# Patient Record
Sex: Female | Born: 2000 | Race: White | Hispanic: No | Marital: Single | Smoking: Never smoker
Health system: Southern US, Community
[De-identification: ages and names within clinical notes are randomized; demographics above are authoritative.]

## PROBLEM LIST (undated history)

## (undated) HISTORY — PX: DENTAL SURGERY: SHX609

## (undated) HISTORY — PX: HEMANGIOMA EXCISION: SHX1734

---
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
## 2020-05-18 ENCOUNTER — Ambulatory Visit: Payer: Managed Care, Other (non HMO) | Attending: Internal Medicine

## 2020-05-18 DIAGNOSIS — Z23 Encounter for immunization: Secondary | ICD-10-CM

## 2020-05-18 NOTE — Progress Notes (Signed)
   Covid-19 Vaccination Clinic  Name:  Traci Owen    MRN: [id number] DOB: 2001-04-08  05/18/2020  [name] was observed post Covid-19 immunization for 15 minutes without incident. She was provided with Vaccine Information Sheet and instruction to access the V-Safe system.   [name] was instructed to call 911 with any severe reactions post vaccine: Marland Kitchen Difficulty breathing  . Swelling of face and throat  . A fast heartbeat  . A bad rash all over body  . Dizziness and weakness   Immunizations Administered    Name Date Dose VIS Date Route   Pfizer COVID-19 Vaccine 05/18/2020 10:05 AM 0.3 mL 01/29/2019 Intramuscular   Manufacturer: [employer], [employer]   Lot: [id number]   NDC: [address]-4

## 2020-09-09 ENCOUNTER — Other Ambulatory Visit: Payer: Self-pay

## 2020-09-09 ENCOUNTER — Ambulatory Visit
Admission: RE | Admit: 2020-09-09 | Discharge: 2020-09-09 | Disposition: A | Discharge status: Home / Self Care | Payer: BC Managed Care – PPO | Source: Ambulatory Visit | Attending: Emergency Medicine | Admitting: Emergency Medicine

## 2020-09-09 VITALS — BP 121/75 | HR 62 | Temp 98.4°F | Resp 17

## 2020-09-09 DIAGNOSIS — J0140 Acute pansinusitis, unspecified: Secondary | ICD-10-CM | POA: Diagnosis not present

## 2020-09-09 DIAGNOSIS — H66002 Acute suppurative otitis media without spontaneous rupture of ear drum, left ear: Secondary | ICD-10-CM

## 2020-09-09 MED ORDER — BENZONATATE 100 MG PO CAPS: 100.0000 mg | ORAL_CAPSULE | Freq: Three times a day (TID) | ORAL | 0 refills | Status: DC

## 2020-09-09 MED ORDER — DOXYCYCLINE HYCLATE 100 MG PO CAPS: 100.0000 mg | ORAL_CAPSULE | Freq: Two times a day (BID) | ORAL | 0 refills | Status: DC

## 2020-09-09 NOTE — Discharge Instructions (Signed)
Get plenty of rest and push fluids Tessalon Perles prescribed for cough Doxycycline prescribed for sinus infection and ear infection Use OTC zyrtec for nasal congestion, runny nose, and/or sore throat Use OTC flonase for nasal congestion and runny nose Use medications daily for symptom relief Use OTC medications like ibuprofen or tylenol as needed fever or pain Call or go to the ED if you have any new or worsening symptoms such as fever, worsening cough, shortness of breath, chest tightness, chest pain, turning blue, changes in mental status, etc..Marland Kitchen

## 2020-09-09 NOTE — ED Provider Notes (Signed)
[facility]   [id number] 09/09/20 Arrival Time: 1611   CC: sinus pain  SUBJECTIVE: History from: patient.  Traci Owen is a 19 y.o. female who presents with sinus congestion, pain, pressure, sore throat, and cough x 1 weeks.  Denies known sick exposure to COVID, flu or strep.  COVID test negative 5 days ago.  Has tried OTC medications without relief.  Symptoms are made worse in the morning.  Reports previous symptoms in the past with sinus infection.   Denies fever, chills, SOB, wheezing, chest pain, nausea, changes in bowel or bladder habits.    ROS: As per HPI.  All other pertinent ROS negative.     History reviewed. No pertinent past medical history. Past Surgical History:  Procedure Laterality Date  . DENTAL SURGERY    . HEMANGIOMA EXCISION     Allergies  Allergen Reactions  . Penicillins Hives  . Sulfa Antibiotics Hives   No current facility-administered medications on file prior to encounter.   No current outpatient medications on file prior to encounter.   Social History   Socioeconomic History  . Marital status: Single    Spouse name: Not on file  . Number of children: Not on file  . Years of education: Not on file  . Highest education level: Not on file  Occupational History  . Not on file  Tobacco Use  . Smoking status: Never Smoker  . Smokeless tobacco: Never Used  Substance and Sexual Activity  . Alcohol use: Never  . Drug use: Never  . Sexual activity: Not on file  Other Topics Concern  . Not on file  Social History Narrative  . Not on file   Social Determinants of Health   Financial Resource Strain:   . Difficulty of Paying Living Expenses: Not on file  Food Insecurity:   . Worried About Programme researcher, broadcasting/film/video in the Last Year: Not on file  . Ran Out of Food in the Last Year: Not on file  Transportation Needs:   . Lack of Transportation (Medical): Not on file  . Lack of Transportation (Non-Medical): Not on file  Physical Activity:    . Days of Exercise per Week: Not on file  . Minutes of Exercise per Session: Not on file  Stress:   . Feeling of Stress : Not on file  Social Connections:   . Frequency of Communication with Friends and Family: Not on file  . Frequency of Social Gatherings with Friends and Family: Not on file  . Attends Religious Services: Not on file  . Active Member of Clubs or Organizations: Not on file  . Attends Banker Meetings: Not on file  . Marital Status: Not on file  Intimate Partner Violence:   . Fear of Current or Ex-Partner: Not on file  . Emotionally Abused: Not on file  . Physically Abused: Not on file  . Sexually Abused: Not on file   Family History  Problem Relation Age of Onset  . Cancer Father     OBJECTIVE:  Vitals:   09/09/20 1704  BP: 121/75  Pulse: 62  Resp: 17  Temp: 98.4 F (36.9 C)  TempSrc: Oral  SpO2: 98%     General appearance: alert; well-appearing, nontoxic; speaking in full sentences and tolerating own secretions HEENT: NCAT; Ears: EACs clear, RT TM pearly gray, LT TM erythematous; Eyes: PERRL.  EOM grossly intact.Sinus: TTP; Nose: nares patent without rhinorrhea, Throat: oropharynx clear, tonsils non erythematous or enlarged, uvula midline  Neck: supple without LAD Lungs: unlabored respirations, symmetrical air entry; cough: absent; no respiratory distress; CTAB Heart: regular rate and rhythm.  Skin: warm and dry Psychological: alert and cooperative; normal mood and affect   ASSESSMENT & PLAN:  1. Acute non-recurrent pansinusitis   2. Non-recurrent acute suppurative otitis media of left ear without spontaneous rupture of tympanic membrane     Meds ordered this encounter  Medications  . doxycycline (VIBRAMYCIN) 100 MG capsule    Sig: Take 1 capsule (100 mg total) by mouth 2 (two) times daily.    Dispense:  20 capsule    Refill:  0    Order Specific Question:   Supervising Provider    Answer:   Eustace Moore [[id number]]     Get plenty of rest and push fluids Tessalon Perles prescribed for cough Doxycycline prescribed for sinus infection and ear infection Use OTC zyrtec for nasal congestion, runny nose, and/or sore throat Use OTC flonase for nasal congestion and runny nose Use medications daily for symptom relief Use OTC medications like ibuprofen or tylenol as needed fever or pain Call or go to the ED if you have any new or worsening symptoms such as fever, worsening cough, shortness of breath, chest tightness, chest pain, turning blue, changes in mental status, etc...   Reviewed expectations re: course of current medical issues. Questions answered. Outlined signs and symptoms indicating need for more acute intervention. Patient verbalized understanding. After Visit Summary given.         Rennis Harding, PA-C 09/09/20 1708

## 2020-09-09 NOTE — ED Triage Notes (Signed)
Sinus congestion, ear pain x 1 week.  Neg covid test

## 2020-09-10 ENCOUNTER — Encounter (HOSPITAL_COMMUNITY): Payer: Self-pay | Admitting: *Deleted

## 2021-04-27 ENCOUNTER — Other Ambulatory Visit: Payer: Self-pay

## 2021-04-27 ENCOUNTER — Ambulatory Visit
Admission: RE | Admit: 2021-04-27 | Discharge: 2021-04-27 | Disposition: A | Discharge status: Home / Self Care | Payer: BC Managed Care – PPO | Source: Ambulatory Visit | Attending: Family Medicine | Admitting: Family Medicine

## 2021-04-27 VITALS — BP 127/78 | HR 77 | Temp 98.7°F | Resp 18

## 2021-04-27 DIAGNOSIS — I889 Nonspecific lymphadenitis, unspecified: Secondary | ICD-10-CM | POA: Diagnosis present

## 2021-04-27 DIAGNOSIS — J029 Acute pharyngitis, unspecified: Secondary | ICD-10-CM | POA: Insufficient documentation

## 2021-04-27 DIAGNOSIS — J069 Acute upper respiratory infection, unspecified: Secondary | ICD-10-CM | POA: Insufficient documentation

## 2021-04-27 LAB — POCT RAPID STREP A (OFFICE): Rapid Strep A Screen: NEGATIVE

## 2021-04-27 MED ORDER — AZITHROMYCIN 250 MG PO TABS: 250.0000 mg | ORAL_TABLET | Freq: Every day | ORAL | 0 refills | Status: DC

## 2021-04-27 MED ORDER — AZITHROMYCIN 250 MG PO TABS: 250.0000 mg | ORAL_TABLET | Freq: Every day | ORAL | 0 refills | Status: AC

## 2021-04-27 NOTE — ED Provider Notes (Signed)
[facility]    CSN: [id number] Arrival date & time: 04/27/21  1033      History   Chief Complaint No chief complaint on file.   HPI Traci Owen is a 20 y.o. female.   Reports sore throat for the last few days.  Also reports swollen lymph nodes to the neck for the last few days.  Denies known sick contacts.  Has positive history of COVID.  Has completed COVID vaccines.  Has completed flu vaccine.  Denies headache, shortness of breath, abdominal pain, nausea, vomiting, diarrhea, rash, fever, other symptoms.  ROS per HPI    The history is provided by the patient.    History reviewed. No pertinent past medical history.  There are no problems to display for this patient.   Past Surgical History:  Procedure Laterality Date  . DENTAL SURGERY    . HEMANGIOMA EXCISION      OB History   No obstetric history on file.      Home Medications    Prior to Admission medications   Medication Sig Start Date End Date Taking? Authorizing Provider  azithromycin (ZITHROMAX) 250 MG tablet Take 1 tablet (250 mg total) by mouth daily. Take first 2 tablets together, then 1 every day until finished. 04/27/21   Moshe Cipro, NP  ibuprofen (ADVIL,MOTRIN) 600 MG tablet Take 1 tablet (600 mg total) by mouth every 6 (six) hours as needed. 10/06/14   Lowanda Foster, NP    Family History Family History  Problem Relation Age of Onset  . Cancer Father     Social History Social History   Tobacco Use  . Smoking status: Never Smoker  . Smokeless tobacco: Never Used  Substance Use Topics  . Alcohol use: Never  . Drug use: Never     Allergies   Penicillins, Sulfa antibiotics, Penicillins, and Sulfa antibiotics   Review of Systems Review of Systems   Physical Exam Triage Vital Signs ED Triage Vitals  Enc Vitals Group     BP 04/27/21 1129 127/78     Pulse Rate 04/27/21 1129 77     Resp 04/27/21 1129 18     Temp 04/27/21 1129 98.7 F (37.1 C)     Temp  Source 04/27/21 1129 Oral     SpO2 04/27/21 1129 99 %     Weight --      Height --      Head Circumference --      Peak Flow --      Pain Score 04/27/21 1130 7     Pain Loc --      Pain Edu? --      Excl. in GC? --    No data found.  Updated Vital Signs BP 127/78 (BP Location: Right Arm)   Pulse 77   Temp 98.7 F (37.1 C) (Oral)   Resp 18   LMP 03/14/2021   SpO2 99%   Visual Acuity Right Eye Distance:   Left Eye Distance:   Bilateral Distance:    Right Eye Near:   Left Eye Near:    Bilateral Near:     Physical Exam Vitals and nursing note reviewed.  Constitutional:      General: She is not in acute distress.    Appearance: Normal appearance. She is well-developed. She is ill-appearing.  HENT:     Head: Normocephalic and atraumatic.     Right Ear: Tympanic membrane, ear canal and external ear normal.     Left Ear: Tympanic membrane,  ear canal and external ear normal.     Nose: Congestion present.     Mouth/Throat:     Mouth: Mucous membranes are moist.     Pharynx: Posterior oropharyngeal erythema present.  Eyes:     Conjunctiva/sclera: Conjunctivae normal.     Pupils: Pupils are equal, round, and reactive to light.  Cardiovascular:     Rate and Rhythm: Normal rate and regular rhythm.     Heart sounds: Normal heart sounds. No murmur heard.   Pulmonary:     Effort: Pulmonary effort is normal. No respiratory distress.     Breath sounds: Normal breath sounds. No stridor. No wheezing, rhonchi or rales.  Chest:     Chest wall: No tenderness.  Musculoskeletal:        General: Normal range of motion.     Cervical back: Normal range of motion and neck supple.  Lymphadenopathy:     Cervical: Cervical adenopathy present.  Skin:    General: Skin is warm and dry.     Capillary Refill: Capillary refill takes less than 2 seconds.  Neurological:     General: No focal deficit present.     Mental Status: She is alert and oriented to person, place, and time.   Psychiatric:        Mood and Affect: Mood normal.        Behavior: Behavior normal.        Thought Content: Thought content normal.      UC Treatments / Results  Labs (all labs ordered are listed, but only abnormal results are displayed) Labs Reviewed  CULTURE, GROUP A STREP [facility])  POCT RAPID STREP A (OFFICE)    EKG   Radiology No results found.  Procedures Procedures (including critical care time)  Medications Ordered in UC Medications - No data to display  Initial Impression / Assessment and Plan / UC Course  I have reviewed the triage vital signs and the nursing notes.  Pertinent labs & imaging results that were available during my care of the patient were reviewed by me and considered in my medical decision making (see chart for details).    URI Lymphadenitis Sore throat  Your rapid strep test is negative.  A throat culture is pending; we will call you if it is positive requiring treatment.   Azithromycin prescribed for URI Take as directed and to completion May use Chloraseptic spray over-the-counter for your sore throat Declines COVID and flu test today Follow up with this office or with primary care if symptoms are persisting.  Follow up in the ER for high fever, trouble swallowing, trouble breathing, other concerning symptoms.   Final Clinical Impressions(s) / UC Diagnoses   Final diagnoses:  Sore throat  Lymphadenitis  Upper respiratory tract infection, unspecified type     Discharge Instructions     I have sent in azithromycin for you to take. Take 2 tablets today, then one tablet daily for the next 4 days.  Your rapid strep test is negative.  A throat culture is pending; we will call you if it is positive requiring treatment.    May use chloraseptic spray for sore throat  Follow up with this office or with primary care if symptoms are persisting.  Follow up in the ER for high fever, trouble swallowing, trouble breathing, other  concerning symptoms.     ED Prescriptions    Medication Sig Dispense Auth. Provider   azithromycin (ZITHROMAX) 250 MG tablet  (Status: Discontinued) Take 1 tablet (250  mg total) by mouth daily. Take first 2 tablets together, then 1 every day until finished. 6 tablet Moshe Cipro, NP   azithromycin (ZITHROMAX) 250 MG tablet Take 1 tablet (250 mg total) by mouth daily. Take first 2 tablets together, then 1 every day until finished. 6 tablet Moshe Cipro, NP     PDMP not reviewed this encounter.   Moshe Cipro, NP 05/02/21 [phone]

## 2021-04-27 NOTE — Discharge Instructions (Addendum)
I have sent in azithromycin for you to take. Take 2 tablets today, then one tablet daily for the next 4 days.  Your rapid strep test is negative.  A throat culture is pending; we will call you if it is positive requiring treatment.    May use chloraseptic spray for sore throat  Follow up with this office or with primary care if symptoms are persisting.  Follow up in the ER for high fever, trouble swallowing, trouble breathing, other concerning symptoms.

## 2021-04-27 NOTE — ED Triage Notes (Signed)
Sore throat since Saturday.

## 2021-04-30 LAB — CULTURE, GROUP A STREP (THRC)
